# Patient Record
Sex: Female | Born: 2000 | Race: White | Hispanic: No | Marital: Single
Health system: Southern US, Community
[De-identification: ages and names within clinical notes are randomized; demographics above are authoritative.]

## PROBLEM LIST (undated history)

## (undated) DIAGNOSIS — R55 Syncope and collapse: Secondary | ICD-10-CM

## (undated) HISTORY — DX: Syncope and collapse: R55

---
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
## 2022-02-14 IMAGING — DX DG CHEST 2V
2 series · 2 of 2 positions shown · non-contrast
Comparison: 09/16/2017

CLINICAL DATA: Chest pain

EXAM:
CHEST - 2 VIEW

[chest pa]
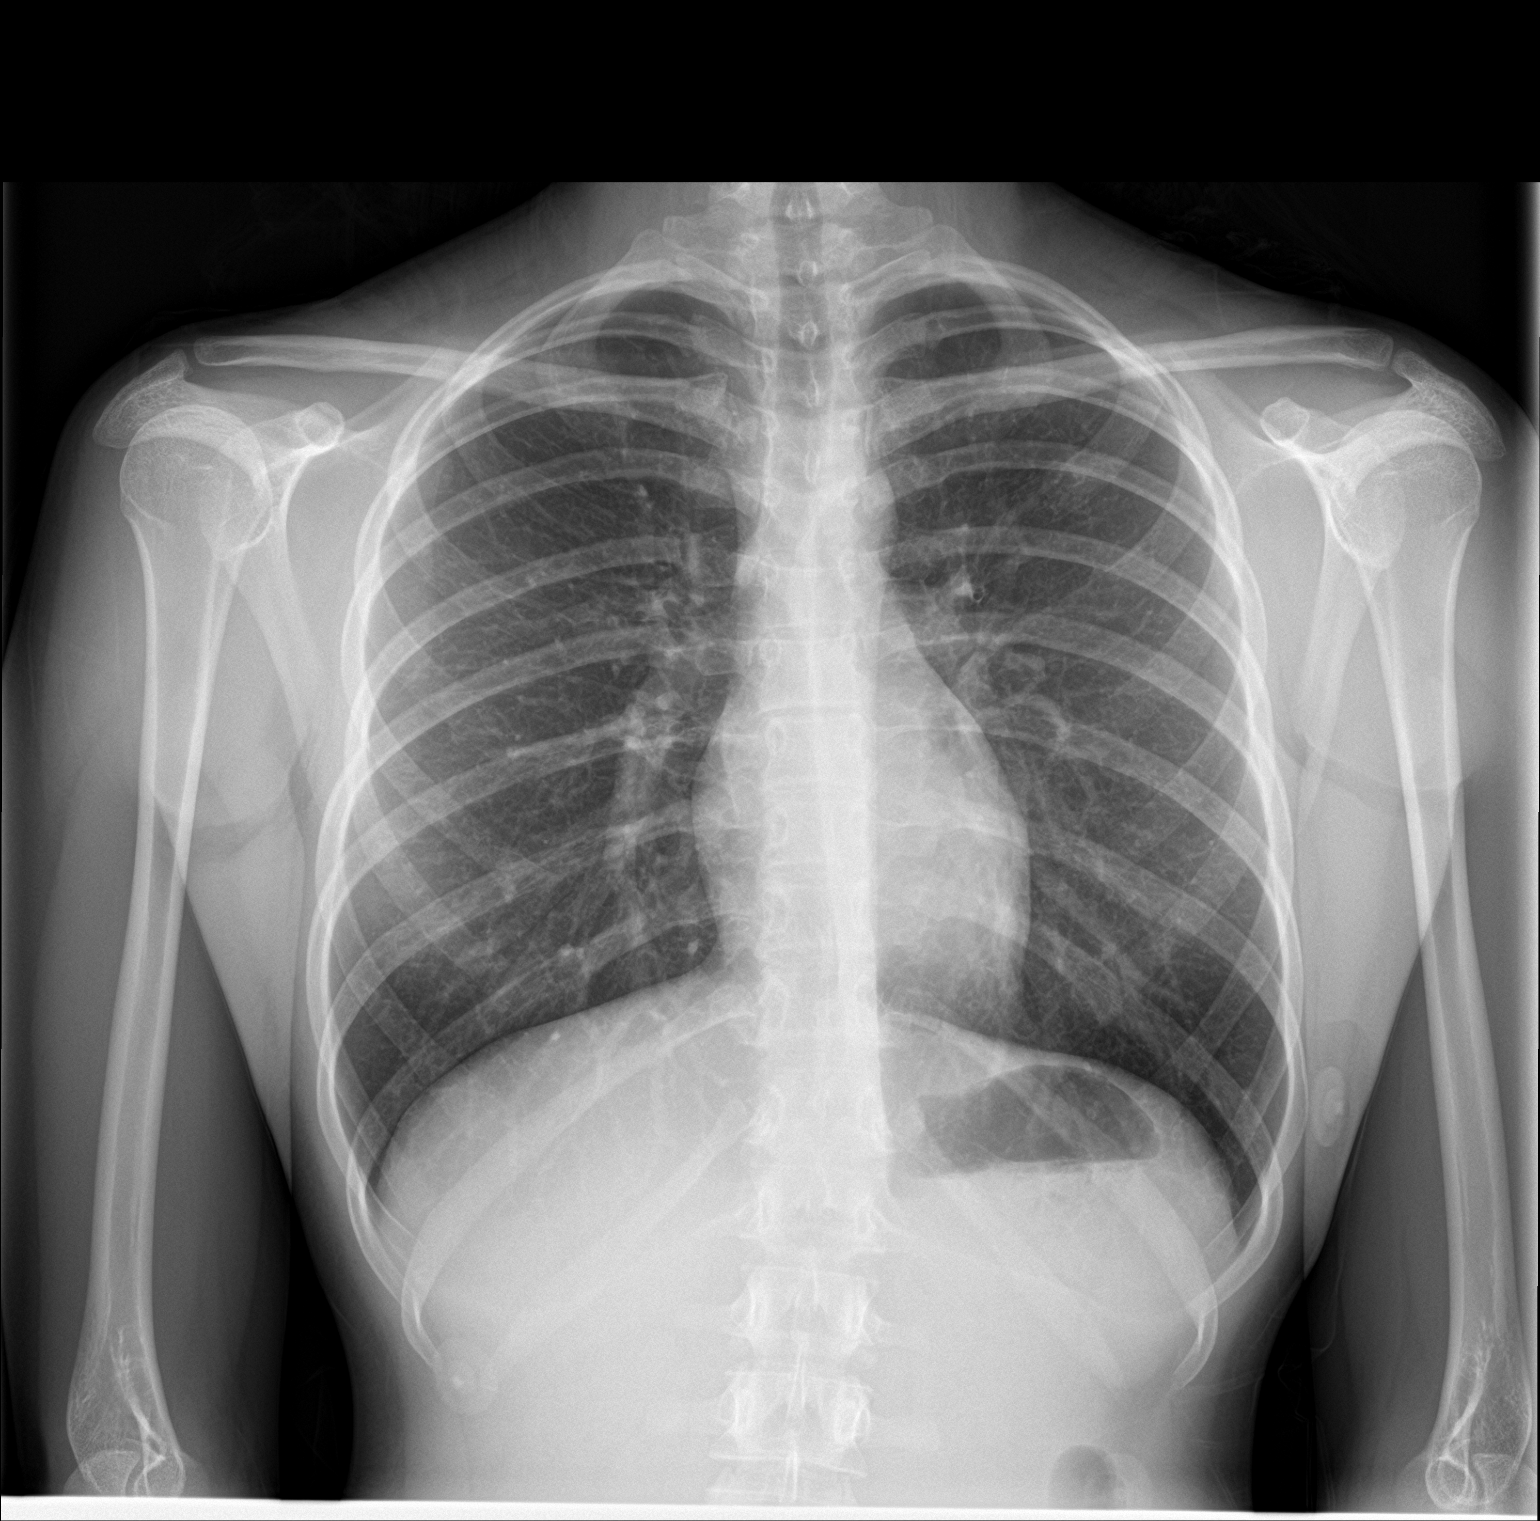

[chest lat]
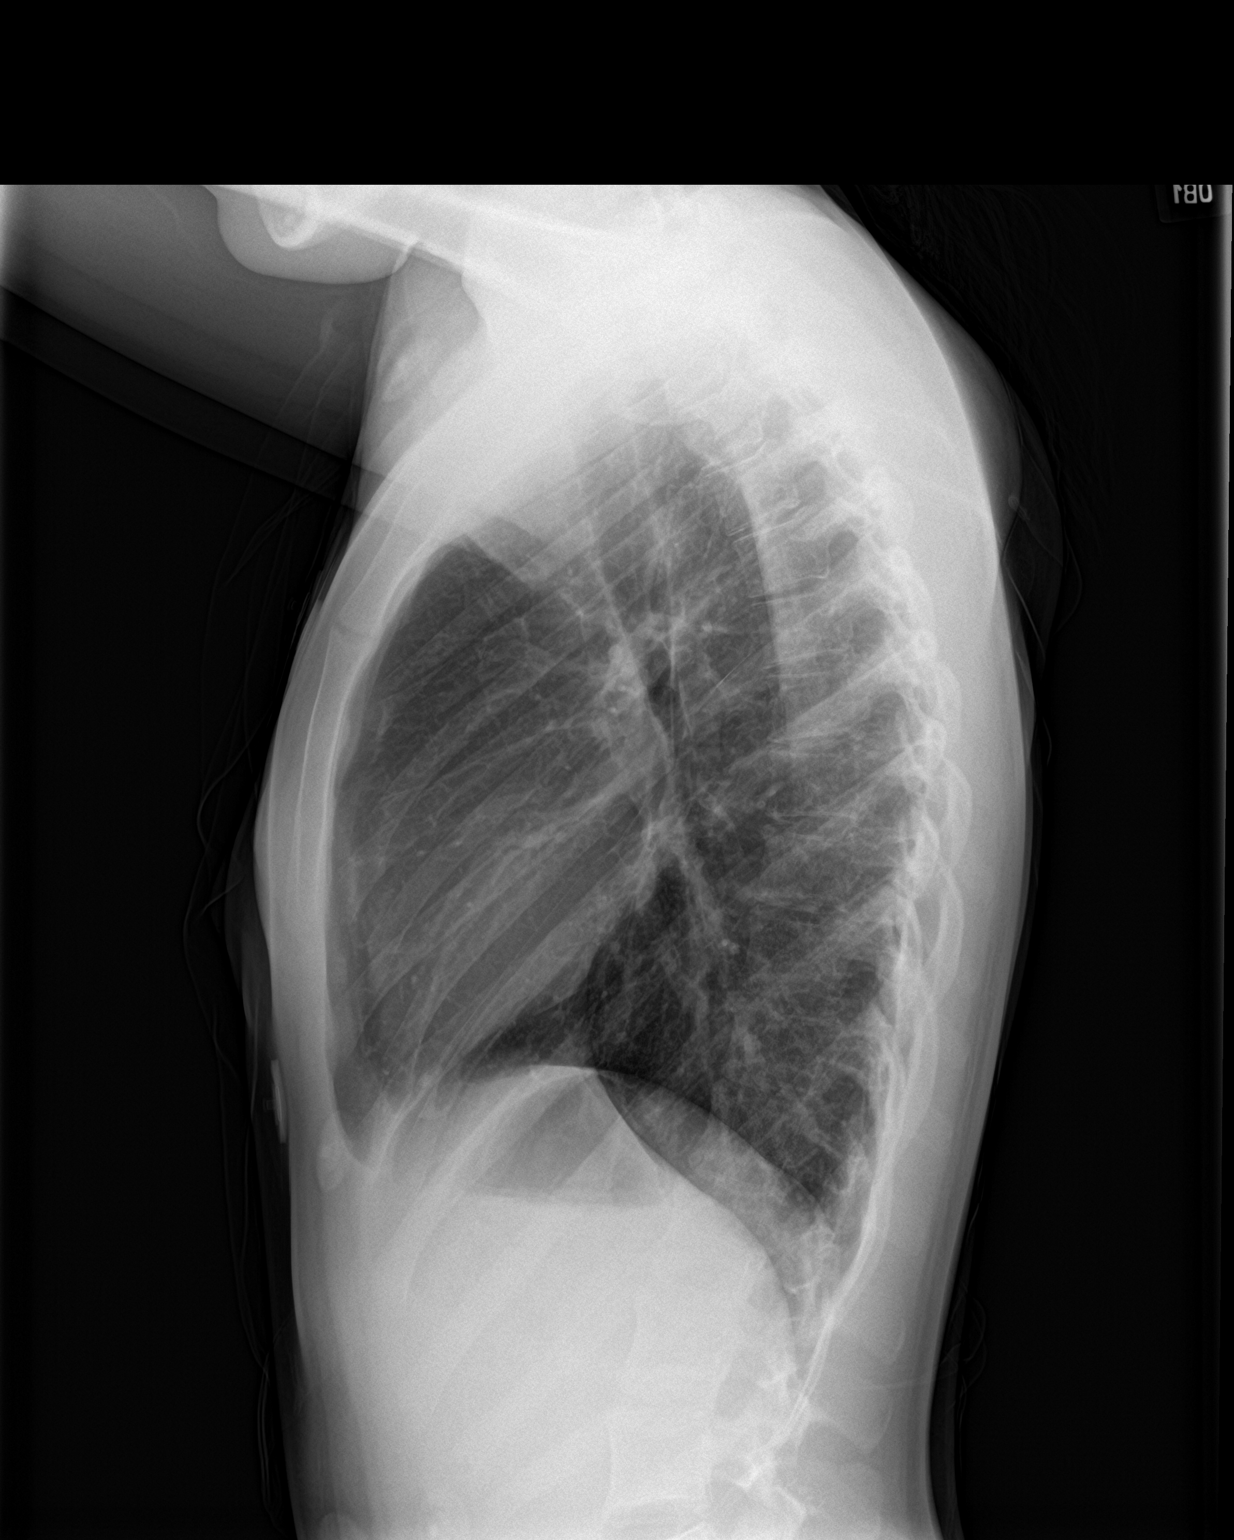

[2 of 2 positions shown; findings below may reference images not displayed]

FINDINGS: The heart size and mediastinal contours are within normal limits.
Both lungs are clear. The visualized skeletal structures are
unremarkable.
IMPRESSION: No active cardiopulmonary disease.
